# Patient Record
Sex: Male | Born: 1985 | Race: White | Hispanic: No | Marital: Single | Smoking: Never smoker
Health system: Southern US, Community
[De-identification: ages and names within clinical notes are randomized; demographics above are authoritative.]

## PROBLEM LIST (undated history)

## (undated) DIAGNOSIS — R634 Abnormal weight loss: Secondary | ICD-10-CM

## (undated) DIAGNOSIS — R29898 Other symptoms and signs involving the musculoskeletal system: Secondary | ICD-10-CM

## (undated) DIAGNOSIS — F419 Anxiety disorder, unspecified: Secondary | ICD-10-CM

## (undated) DIAGNOSIS — F32A Depression, unspecified: Secondary | ICD-10-CM

## (undated) DIAGNOSIS — F329 Major depressive disorder, single episode, unspecified: Secondary | ICD-10-CM

## (undated) DIAGNOSIS — G43909 Migraine, unspecified, not intractable, without status migrainosus: Secondary | ICD-10-CM

## (undated) HISTORY — DX: Other symptoms and signs involving the musculoskeletal system: R29.898

## (undated) HISTORY — DX: Abnormal weight loss: R63.4

## (undated) HISTORY — DX: Migraine, unspecified, not intractable, without status migrainosus: G43.909

## (undated) HISTORY — PX: KNEE SURGERY: SHX244

---
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
## 2006-04-09 IMAGING — CT CT HEAD WO/W CM
1 of 2 series · 13 of 30 positions shown, 17 images · IV contrast (omnipaque)
Comparison: None.

CLINICAL DATA: Right sided headaches with left sided numbness and blurred vision.
 HEAD CT WITHOUT AND WITH CONTRAST:
TECHNIQUE: Contiguous axial images were obtained from the base of the skull through the vertex according to standard protocol before and after administration of intravenous contrast.
 Contrast:  75 cc Omnipaque 300

[Series 2: head w/o · axial · non-contrast · 0.45mm/px · z∈[+13,+133]mm · 13 of 28 slices shown, 17 images]
[im 2/28  brain]
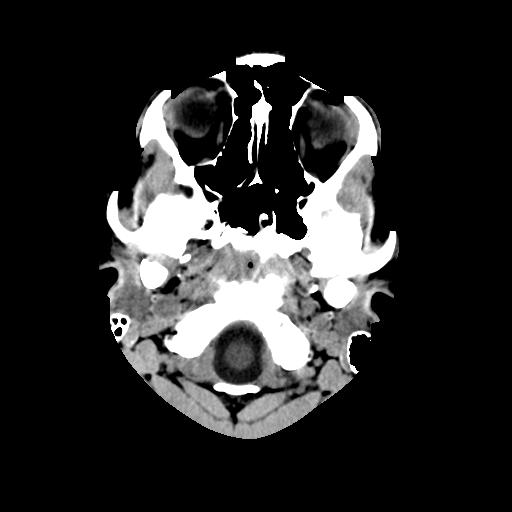
[im 2/28  bone]
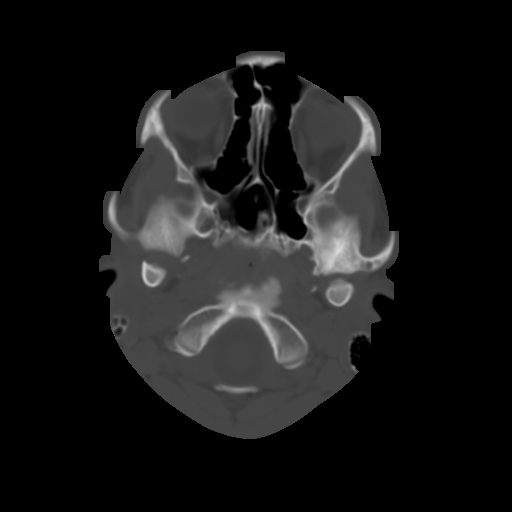
[im 4/28  brain]
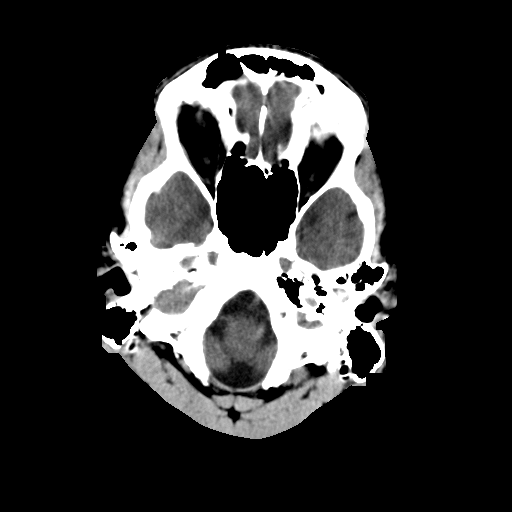
[im 6/28  brain]
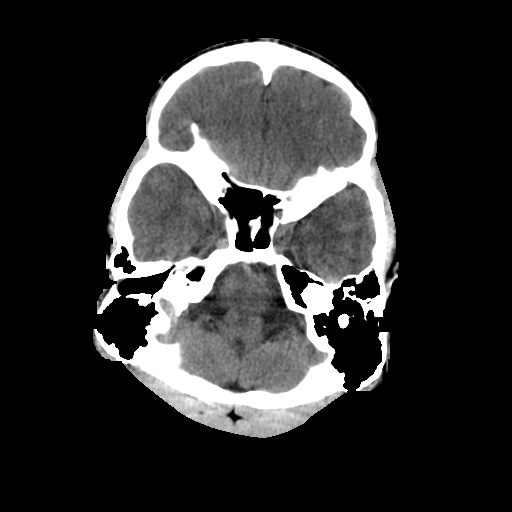
[im 8/28  brain]
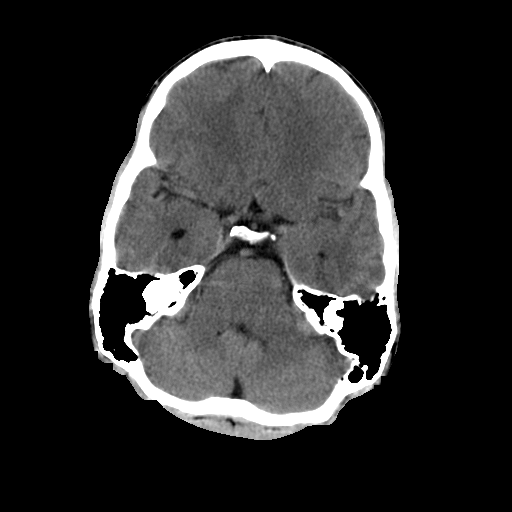
[im 10/28  brain]
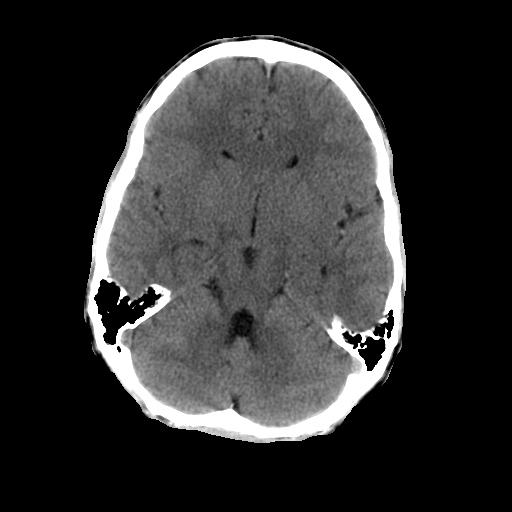
[im 10/28  bone]
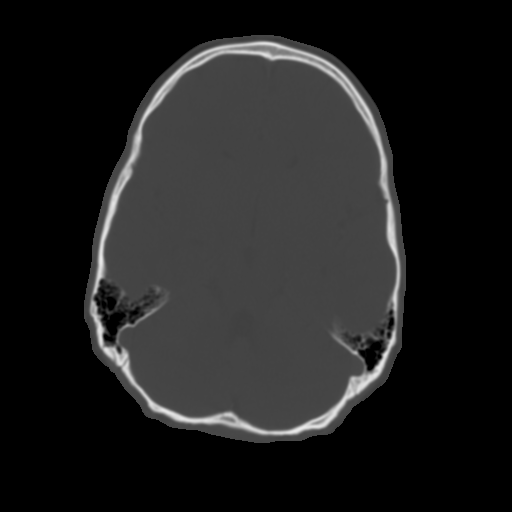
[im 12/28  brain]
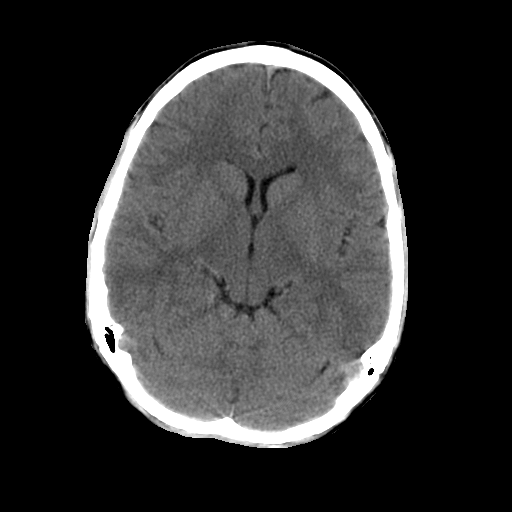
[im 14/28  brain]
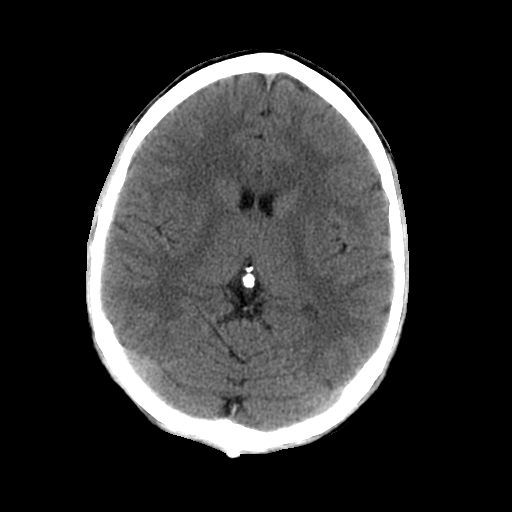
[im 16/28  brain]
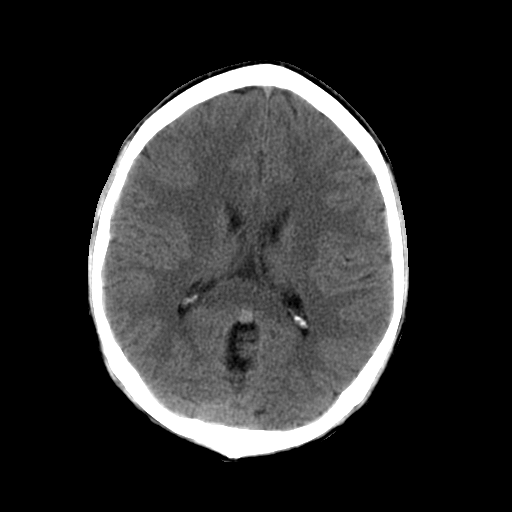
[im 18/28  brain]
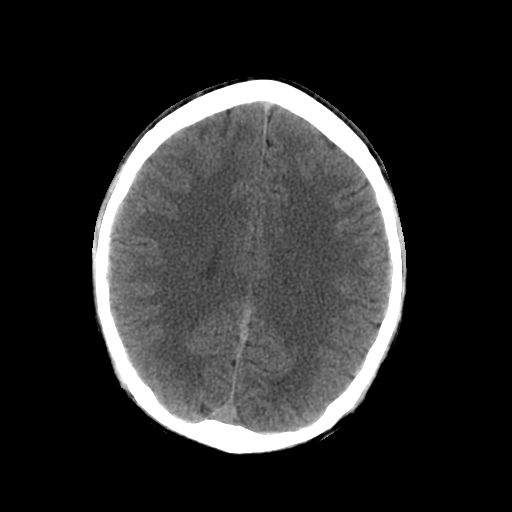
[im 18/28  bone]
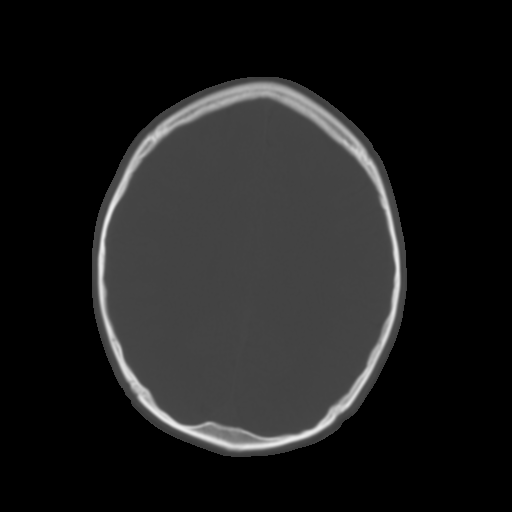
[im 20/28  brain]
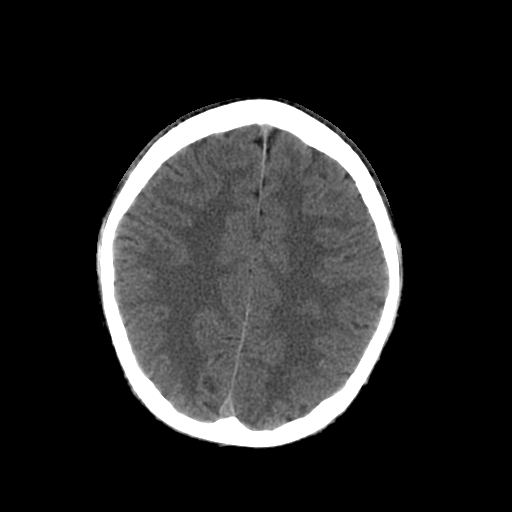
[im 22/28  brain]
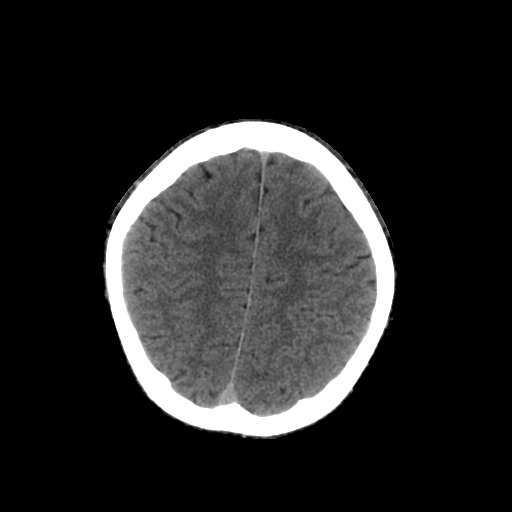
[im 24/28  brain]
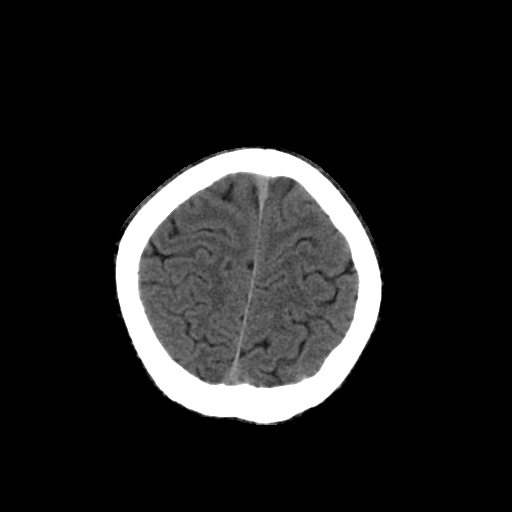
[im 26/28  brain]
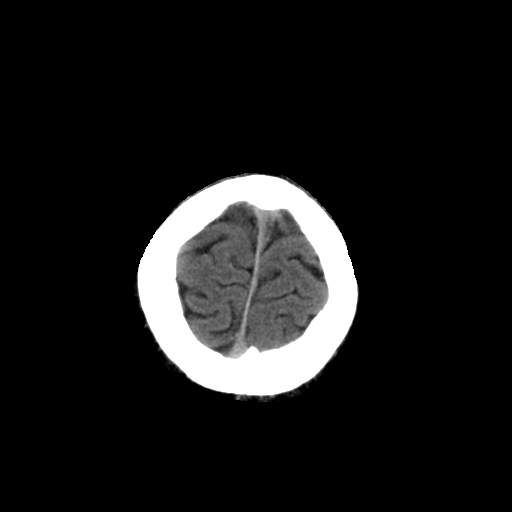
[im 26/28  bone]
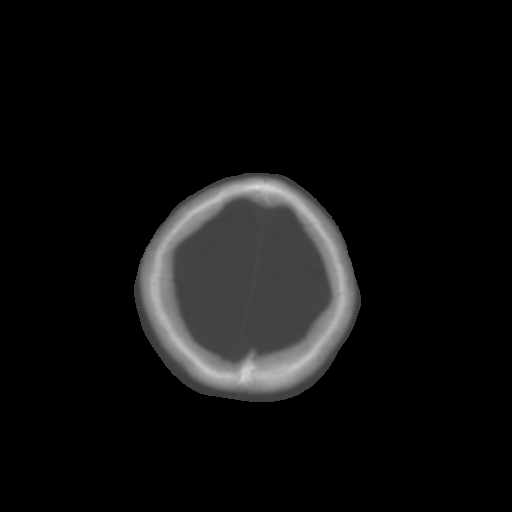

[13 of 30 positions shown; findings below may reference images not displayed]

FINDINGS: Ventricular size and CSF space is normal.  Only normal vascular structures appear to enhance following IV contrast.  Calvarium intact.  No fluid in the sinus is visualized.
IMPRESSION: No acute or significant findings.
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
## 2012-11-26 ENCOUNTER — Encounter: Payer: Self-pay | Admitting: Neurology

## 2012-11-28 ENCOUNTER — Encounter: Payer: Self-pay | Admitting: Neurology

## 2012-11-28 ENCOUNTER — Ambulatory Visit (INDEPENDENT_AMBULATORY_CARE_PROVIDER_SITE_OTHER): Payer: BC Managed Care – PPO | Admitting: Neurology

## 2012-11-28 VITALS — BP 106/66 | HR 66 | Ht 72.0 in | Wt 145.0 lb

## 2012-11-28 DIAGNOSIS — G7109 Other specified muscular dystrophies: Secondary | ICD-10-CM

## 2012-11-28 DIAGNOSIS — G7100 Muscular dystrophy, unspecified: Secondary | ICD-10-CM | POA: Insufficient documentation

## 2012-11-28 DIAGNOSIS — G43909 Migraine, unspecified, not intractable, without status migrainosus: Secondary | ICD-10-CM

## 2012-11-28 NOTE — Progress Notes (Signed)
[facility]  PATIENT: Brian Macias DOB: 14-Aug-1985  HISTORICAL  Brian Macias is a 27 years old right-handed male, referred by his primary care physician Tilden Fossa for evaluation of gradual onset muscle weakness, atrophy.  He began to notice muscle weakness around 2006, he could no longer do pushups, pull-ups, gradually worse, also noticed muscle atrophy involving bilateral upper extremity, upper body, over the years, his upper body muscle weakness, atrophy gradually progressed, now he can not bend in his elbow, he also noticed difficulty whistle, he was never able to move his upper lip, mild eye closure weakness.  He denies significant pain, he denies lower extremity muscle atrophy or weakness. He has occasionally fingertips paresthesia  He denies swallowing difficulty, no chewing difficulty.  REVIEW OF SYSTEMS: Full 14 system review of systems performed and notable only for bilateral upper extremity weakness,  ALLERGIES: No Known Allergies  HOME MEDICATIONS: Outpatient Prescriptions Prior to Visit  Medication Sig Dispense Refill  . Cholecalciferol (VITAMIN D PO) Take by mouth as directed.      . Cyanocobalamin (VITAMIN B 12 PO) Take by mouth as directed.      Marland Kitchen glucosamine-chondroitin 500-400 MG tablet Take 1 tablet by mouth as directed.      Marland Kitchen MILK THISTLE PO Take by mouth as directed.      . Misc Natural Products (NF FORMULAS TESTOSTERONE) CAPS Take by mouth as directed.      . Multiple Vitamins-Minerals (MULTIVITAMIN PO) Take by mouth as directed.      . Omega-3 Fatty Acids (ULTRA OMEGA 3 PO) Take by mouth as directed.      . Protein POWD Take by mouth as directed.      Marland Kitchen VITAMIN E PO Take by mouth as directed.       No facility-administered medications prior to visit.    PAST MEDICAL HISTORY: Past Medical History  Diagnosis Date  . Migraines   . Weight loss   . Muscle function loss     PAST SURGICAL HISTORY: Past Surgical History  Procedure  Laterality Date  . Knee surgery Right     FAMILY HISTORY: Family History  Problem Relation Age of Onset  . Asthma Father   . Allergies Father   . High Cholesterol Father   Dm      mother  SOCIAL HISTORY:  History   Social History  . Marital Status: Single    Spouse Name: N/A    Number of Children: 0  . Years of Education: college   Occupational History    Teacher, music, transcription,    Social History Main Topics  . Smoking status: Never Smoker   . Smokeless tobacco: Never Used  . Alcohol Use: 0.6 oz/week    1 Cans of beer per week     Comment: once a week.  . Drug Use: No  . Sexual Activity: Not on file   Other Topics Concern  . 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 And he is going to school full time.   Exercise - Kick boxing and Judo   Left handed.   Caffeine- once or twice a week tea.    PHYSICAL EXAM   Filed Vitals:   11/28/12 0805  BP: 106/66  Pulse: 66  Height: 6' (1.829 m)  Weight: 145 lb (65.772 kg)   Body mass index is 19.66 kg/(m^2).   Generalized: In no acute distress  Neck:  Supple, no carotid bruits   Cardiac: Regular rate rhythm  Pulmonary: Clear to auscultation bilaterally  Musculoskeletal: No deformity  Neurological examination  Mentation: Alert oriented to time, place, history taking, and causual conversation  Cranial nerve II-XII: Pupils were equal round reactive to light extraocular movements were full, visual field were full on confrontational test. facial sensation and strength were normal. hearing was intact to finger rubbing bilaterally. Uvula tongue midline.  head turning and shoulder shrug and were normal and symmetric.Tongue protrusion into cheek strength was normal.  Motor: fairly symmetric muscle atrophy involving bilateral biceps, brachialis, triceps, pectoralis major, latissimus dorsi.   there was no significant atrophy of bilateral lower  extremity muscles.  Shoulder abduction 4 plus/4 plus, external rotation 4 4, elbow flexion 2/2, extension 3/3, wrist flexion 5/5, wrist extension 4+/4+, grip 4+/4+, shoulder adduction 3+/3+, bilateral lower extremity muscle strength was normal. Lower abdomen beevor's sign when he tried to get up from lying down position.  Sensory: Intact to fine touch, pinprick, preserved vibratory sensation, and proprioception at toes.  Coordination: Normal finger to nose, heel-to-shin bilaterally there was no truncal ataxia  Gait: Rising up from seated position without assistance, normal stance, without trunk ataxia, moderate stride, good arm swing, smooth turning, able to perform tiptoe, and heel walking without difficulty.   Romberg signs: Negative  Deep tendon reflexes: Brachioradialis 2/2, biceps 2/2, triceps 2/2, patellar 2/2, Achilles trace, plantar responses were flexor bilaterally.   DIAGNOSTIC DATA (LABS, IMAGING, TESTING) - I reviewed patient records, labs, notes, testing and imaging myself where available.   ASSESSMENT AND PLAN   27 years old male, presenting with progressive muscle atrophy, and weakness, mainly involving bilateral facial muscles, scapulohumeral muscles, fairly symmetric, there was no family history of similar disease, 1.   Most consistent with facioscapulohumeral muscular atrophy, but the atypical features, he is symmetric clinical presentation, lack of family history, 10-30% of patient has de novo deletion. 2. I have suggested Athena genetic testing,  3. I will also refer him to [facility] clinic,  Levert Feinstein, M.D. Ph.D.  [facility] [address], [address] [place], [place] [address] [phone]

## 2012-11-28 NOTE — Patient Instructions (Signed)
[facility]  [address] [place], [id number]  [place]

## 2013-02-21 ENCOUNTER — Ambulatory Visit: Payer: BC Managed Care – PPO | Attending: Nurse Practitioner

## 2013-02-21 ENCOUNTER — Telehealth: Payer: Self-pay | Admitting: *Deleted

## 2013-02-21 DIAGNOSIS — IMO0001 Reserved for inherently not codable concepts without codable children: Secondary | ICD-10-CM | POA: Insufficient documentation

## 2013-02-21 DIAGNOSIS — M6281 Muscle weakness (generalized): Secondary | ICD-10-CM | POA: Insufficient documentation

## 2013-02-27 ENCOUNTER — Ambulatory Visit: Payer: BC Managed Care – PPO | Admitting: Physical Therapy

## 2013-02-28 ENCOUNTER — Ambulatory Visit: Payer: BC Managed Care – PPO | Admitting: Physical Therapy

## 2013-03-06 ENCOUNTER — Ambulatory Visit: Payer: BC Managed Care – PPO | Attending: Nurse Practitioner

## 2013-03-06 DIAGNOSIS — M6281 Muscle weakness (generalized): Secondary | ICD-10-CM | POA: Insufficient documentation

## 2013-03-06 DIAGNOSIS — IMO0001 Reserved for inherently not codable concepts without codable children: Secondary | ICD-10-CM | POA: Insufficient documentation

## 2013-03-08 ENCOUNTER — Ambulatory Visit: Payer: BC Managed Care – PPO | Admitting: Physical Therapy

## 2013-03-15 ENCOUNTER — Ambulatory Visit: Payer: BC Managed Care – PPO | Admitting: Physical Therapy

## 2013-03-17 ENCOUNTER — Ambulatory Visit: Payer: BC Managed Care – PPO | Admitting: Physical Therapy

## 2013-04-07 ENCOUNTER — Ambulatory Visit (INDEPENDENT_AMBULATORY_CARE_PROVIDER_SITE_OTHER): Payer: BC Managed Care – PPO | Admitting: Neurology

## 2013-04-07 ENCOUNTER — Encounter: Payer: Self-pay | Admitting: Neurology

## 2013-04-07 ENCOUNTER — Encounter (INDEPENDENT_AMBULATORY_CARE_PROVIDER_SITE_OTHER): Payer: Self-pay

## 2013-04-07 VITALS — BP 95/62 | HR 82 | Ht 72.0 in | Wt 142.0 lb

## 2013-04-07 DIAGNOSIS — G7100 Muscular dystrophy, unspecified: Secondary | ICD-10-CM

## 2013-04-07 DIAGNOSIS — G43909 Migraine, unspecified, not intractable, without status migrainosus: Secondary | ICD-10-CM

## 2013-04-07 DIAGNOSIS — G7109 Other specified muscular dystrophies: Secondary | ICD-10-CM

## 2013-04-07 NOTE — Patient Instructions (Signed)
[facility]  Echocardiogram  Return to clinic in 6-9 months  [facility] clinic refer

## 2013-04-07 NOTE — Progress Notes (Signed)
[facility]  PATIENT: Brian Macias DOB: 11/04/1985  HISTORICAL  Initial visit September 2014:  Brian Macias is a 28 years old right-handed male, referred by his primary care physician Tilden FossaLeslie Brown for evaluation of gradual onset muscle weakness, atrophy.  He began to notice muscle weakness around 2006, he could no longer do pushups, pull-ups, gradually worse, also noticed muscle atrophy involving bilateral upper extremity, upper body, over the years, his upper body muscle weakness, atrophy gradually progressed, now he can not bend in his elbow, he also noticed difficulty whistle, he was never able to move his upper lip, mild eye closure weakness.  He denies significant pain, he denies lower extremity muscle atrophy or weakness. He has occasionally fingertips paresthesia  He denies swallowing difficulty, no chewing difficulty.  He has difficulty release tight grip sometimes,  He has no chest pain, no shortness of breath, no visual loss.  He can still play soccer, he can walk fast,  Main weakness involves face and upper arms.   His mother is 27 years old, she has DM, over weight, knee problem, gait difficulty due to knee pain, MGF died of CAD. Father had CAD, and HTN, no muscle weakness.  His maternal uncle is very skinny, "like me, never play sport", his younger brother is [id number], is muscular and healthy.  UPDATE Feb 6th 2015:  He wants to be referred to [facility] clinic, he had physical therapy, helps some, wants to try occupational therapy, he continued to have significant bilateral upper extremity weakness, denies sensory changes, denies significant gait difficulty  I also performed electromyography study today, right biceps, increasing exertion activity, multiple small spikes polyphasic motor units, with early recruitment patterns, consistent with myopathic changes, the needle examination of right first dorsal interossei, where his hand muscle strength is relatively preserved, the  recruitment pattern, motor unit potential was relatively normal         REVIEW OF SYSTEMS: Full 14 system review of systems performed and notable only for bilateral upper extremity weakness,  ALLERGIES: No Known Allergies  HOME MEDICATIONS: Outpatient Prescriptions Prior to Visit  Medication Sig Dispense Refill  . Cholecalciferol (VITAMIN D PO) Take by mouth as directed.      . Cyanocobalamin (VITAMIN B 12 PO) Take by mouth as directed.      Marland Kitchen. glucosamine-chondroitin 500-400 MG tablet Take 1 tablet by mouth as directed.      Marland Kitchen. MILK THISTLE PO Take by mouth as directed.      . Misc Natural Products (NF FORMULAS TESTOSTERONE) CAPS Take by mouth as directed.      . Multiple Vitamins-Minerals (MULTIVITAMIN PO) Take by mouth as directed.      . Omega-3 Fatty Acids (ULTRA OMEGA 3 PO) Take by mouth as directed.      . Protein POWD Take by mouth as directed.      Marland Kitchen. VITAMIN E PO Take by mouth as directed.       No facility-administered medications prior to visit.    PAST MEDICAL HISTORY: Past Medical History  Diagnosis Date  . Migraines   . Weight loss   . Muscle function loss     PAST SURGICAL HISTORY: Past Surgical History  Procedure Laterality Date  . Knee surgery Right     FAMILY HISTORY: Family History  Problem Relation Age of Onset  . Asthma Father   . Allergies Father   . High Cholesterol Father   Dm      mother  SOCIAL HISTORY:  History  Social History  . Marital Status: Single    Spouse Name: N/A    Number of Children: 0  . Years of Education: college   Occupational History    Teacher, music, transcription,    Social History Main Topics  . Smoking status: Never Smoker   . Smokeless tobacco: Never Used  . Alcohol Use: 0.6 oz/week    1 Cans of beer per week     Comment: once a week.  . Drug Use: No  . Sexual Activity: Not on file   Other Topics Concern  . 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 And he is going to school full time.   Exercise - Kick boxing and Judo   Left handed.   Caffeine- once or twice a week tea.    PHYSICAL EXAM   Filed Vitals:   04/07/13 1444  BP: 95/62  Pulse: 82  Height: 6' (1.829 m)  Weight: 142 lb (64.411 kg)   Body mass index is 19.25 kg/(m^2).   Generalized: In no acute distress  Neck: Supple, no carotid bruits   Cardiac: Regular rate rhythm  Pulmonary: Clear to auscultation bilaterally  Musculoskeletal: No deformity  Neurological examination  Mentation: Alert oriented to time, place, history taking, and causual conversation  Cranial nerve II-XII: Pupils were equal round reactive to light extraocular movements were full, visual field were full on confrontational test. facial sensation and strength were normal. hearing was intact to finger rubbing bilaterally. Uvula tongue midline.  head turning and shoulder shrug and were normal and symmetric.Tongue protrusion into cheek strength was normal.  Motor: fairly symmetric muscle atrophy involving bilateral biceps, brachialis, triceps, pectoralis major, latissimus dorsi.  bilateral deltoid muscle strands, and wrist flexion, and hand muscle strands was relatively preserved,  there was no significant atrophy of bilateral lower extremity muscles.  Shoulder abduction 4 plus/4 plus, external rotation 4 4, elbow flexion 3/3, extension 3/3, wrist flexion 5/5, wrist extension 4/4 , grip 4+/4+, shoulder adduction 3+/3+, bilateral lower extremity muscle strength was normal. Lower abdomen beevor's sign when he tried to get up from lying down position.  Sensory: Intact to fine touch, pinprick, preserved vibratory sensation, and proprioception at toes.  Coordination: Normal finger to nose, heel-to-shin bilaterally there was no truncal ataxia  Gait: Rising up from seated position without assistance, normal stance, without trunk ataxia, moderate stride, good arm swing, smooth turning, able  to perform tiptoe, and heel walking without difficulty.   Romberg signs: Negative  Deep tendon reflexes: Brachioradialis 2/2, biceps 2/2, triceps 2/2, patellar 2/2, Achilles trace, plantar responses were flexor bilaterally.   DIAGNOSTIC DATA (LABS, IMAGING, TESTING) - I reviewed patient records, labs, notes, testing and imaging myself where available.   ASSESSMENT AND PLAN    28  years old male, presenting with progressive muscle atrophy, and weakness, mainly involving bilateral facial muscles, scapulohumeral muscles, fairly symmetric, there was no family history of similar disease,  1.   Most consistent with facioscapulohumeral muscular atrophy, but the atypical feature is symmetric clinical presentation, lack of family history, 10-30% of patient has de novo deletion. 2. I have referred him to [facility] clinic, 3 Will also do echocardiogram 4 return to clinic in 6 months.  Levert Feinstein, M.D. Ph.D.  [facility] [address] [place], [place] [address] [phone]

## 2013-04-25 ENCOUNTER — Ambulatory Visit: Payer: BC Managed Care – PPO | Admitting: Neurology

## 2013-04-27 ENCOUNTER — Other Ambulatory Visit (HOSPITAL_COMMUNITY): Payer: BC Managed Care – PPO

## 2013-06-01 ENCOUNTER — Telehealth: Payer: Self-pay

## 2013-06-01 NOTE — Telephone Encounter (Signed)
Called patient and left him a message about his follow up appt. At the [facility] clinic.

## 2013-09-29 NOTE — Telephone Encounter (Signed)
Done per Arma Headingana C

## 2013-09-29 NOTE — Telephone Encounter (Signed)
done

## 2014-01-05 ENCOUNTER — Telehealth: Payer: Self-pay | Admitting: Neurology

## 2014-01-05 NOTE — Telephone Encounter (Signed)
Left message for patient regarding new appointment date and time, rescheduled from 11/12 @ 2:30 to 11/16 @ 10:15.  Requested a return call if unable to keep appointment.

## 2014-01-11 ENCOUNTER — Ambulatory Visit: Payer: BC Managed Care – PPO | Admitting: Neurology

## 2014-01-15 ENCOUNTER — Ambulatory Visit: Payer: BC Managed Care – PPO | Admitting: Neurology

## 2014-01-16 ENCOUNTER — Encounter: Payer: Self-pay | Admitting: *Deleted

## 2014-09-21 DIAGNOSIS — Z0289 Encounter for other administrative examinations: Secondary | ICD-10-CM

## 2014-09-23 ENCOUNTER — Encounter (HOSPITAL_COMMUNITY): Payer: Self-pay

## 2014-09-23 ENCOUNTER — Emergency Department (HOSPITAL_COMMUNITY)
Admission: EM | Admit: 2014-09-23 | Discharge: 2014-09-25 | Disposition: A | Discharge status: Home / Self Care | Payer: Federal, State, Local not specified - Other | Attending: Emergency Medicine | Admitting: Emergency Medicine

## 2014-09-23 DIAGNOSIS — F1210 Cannabis abuse, uncomplicated: Secondary | ICD-10-CM | POA: Diagnosis present

## 2014-09-23 DIAGNOSIS — R636 Underweight: Secondary | ICD-10-CM | POA: Insufficient documentation

## 2014-09-23 DIAGNOSIS — M62529 Muscle wasting and atrophy, not elsewhere classified, unspecified upper arm: Secondary | ICD-10-CM | POA: Insufficient documentation

## 2014-09-23 DIAGNOSIS — F22 Delusional disorders: Secondary | ICD-10-CM | POA: Insufficient documentation

## 2014-09-23 DIAGNOSIS — F322 Major depressive disorder, single episode, severe without psychotic features: Secondary | ICD-10-CM | POA: Diagnosis present

## 2014-09-23 DIAGNOSIS — Z8669 Personal history of other diseases of the nervous system and sense organs: Secondary | ICD-10-CM | POA: Insufficient documentation

## 2014-09-23 DIAGNOSIS — Z79899 Other long term (current) drug therapy: Secondary | ICD-10-CM | POA: Insufficient documentation

## 2014-09-23 DIAGNOSIS — Z9189 Other specified personal risk factors, not elsewhere classified: Secondary | ICD-10-CM

## 2014-09-23 DIAGNOSIS — G43909 Migraine, unspecified, not intractable, without status migrainosus: Secondary | ICD-10-CM | POA: Insufficient documentation

## 2014-09-23 LAB — CBC WITH DIFFERENTIAL/PLATELET
Basophils Absolute: 0.1 10*3/uL (ref 0.0–0.1)
Basophils Relative: 0 % (ref 0–1)
Eosinophils Absolute: 0 10*3/uL (ref 0.0–0.7)
Eosinophils Relative: 0 % (ref 0–5)
HCT: 47.7 % (ref 39.0–52.0)
Hemoglobin: 15.1 g/dL (ref 13.0–17.0)
Lymphocytes Relative: 17 % (ref 12–46)
Lymphs Abs: 2.6 10*3/uL (ref 0.7–4.0)
MCH: 25.2 pg — ABNORMAL LOW (ref 26.0–34.0)
MCHC: 31.7 g/dL (ref 30.0–36.0)
MCV: 79.5 fL (ref 78.0–100.0)
Monocytes Absolute: 1.1 10*3/uL — ABNORMAL HIGH (ref 0.1–1.0)
Monocytes Relative: 7 % (ref 3–12)
Neutro Abs: 11.8 10*3/uL — ABNORMAL HIGH (ref 1.7–7.7)
Neutrophils Relative %: 76 % (ref 43–77)
Platelets: 382 10*3/uL (ref 150–400)
RBC: 6 MIL/uL — ABNORMAL HIGH (ref 4.22–5.81)
RDW: 13.4 % (ref 11.5–15.5)
WBC: 15.5 10*3/uL — ABNORMAL HIGH (ref 4.0–10.5)

## 2014-09-23 LAB — COMPREHENSIVE METABOLIC PANEL
ALK PHOS: 87 U/L (ref 38–126)
ALT: 42 U/L (ref 17–63)
AST: 47 U/L — ABNORMAL HIGH (ref 15–41)
Albumin: 4.9 g/dL (ref 3.5–5.0)
Anion gap: 14 (ref 5–15)
BUN: 14 mg/dL (ref 6–20)
CO2: 25 mmol/L (ref 22–32)
Calcium: 10.1 mg/dL (ref 8.9–10.3)
Chloride: 100 mmol/L — ABNORMAL LOW (ref 101–111)
Creatinine, Ser: 0.75 mg/dL (ref 0.61–1.24)
GFR calc Af Amer: >60 mL/min (ref >60)
GFR calc non Af Amer: >60 mL/min (ref >60)
Glucose, Bld: 86 mg/dL (ref 65–99)
Potassium: 4.4 mmol/L (ref 3.5–5.1)
Sodium: 139 mmol/L (ref 135–145)
Total Bilirubin: 1.3 mg/dL — ABNORMAL HIGH (ref 0.3–1.2)
Total Protein: 8.4 g/dL — ABNORMAL HIGH (ref 6.5–8.1)

## 2014-09-23 LAB — RAPID URINE DRUG SCREEN, HOSP PERFORMED
Amphetamines: NOT DETECTED
Barbiturates: NOT DETECTED
Benzodiazepines: NOT DETECTED
Cocaine: NOT DETECTED
Opiates: NOT DETECTED
TETRAHYDROCANNABINOL: POSITIVE — AB

## 2014-09-23 LAB — SALICYLATE LEVEL: Salicylate Lvl: <4 mg/dL (ref 2.8–30.0)

## 2014-09-23 LAB — ACETAMINOPHEN LEVEL: Acetaminophen (Tylenol), Serum: <10 ug/mL — ABNORMAL LOW (ref 10–30)

## 2014-09-23 LAB — ETHANOL: Alcohol, Ethyl (B): <5 mg/dL (ref <5)

## 2014-09-23 MED ORDER — ACETAMINOPHEN 325 MG PO TABS: 650.0000 mg | ORAL_TABLET | ORAL | Status: DC | PRN

## 2014-09-23 MED ORDER — ONDANSETRON HCL 4 MG PO TABS: 4.0000 mg | ORAL_TABLET | Freq: Three times a day (TID) | ORAL | Status: DC | PRN
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
## 2014-09-23 NOTE — ED Notes (Signed)
Pt wanded and belongings searched by security. 

## 2014-09-23 NOTE — ED Notes (Signed)
Pt reports that the police brought him here and he is denying SI/HI at this time. Unsure of why he was brought here. He reports that he was not talking to his parents for a few days.
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
## 2014-09-24 DIAGNOSIS — F322 Major depressive disorder, single episode, severe without psychotic features: Secondary | ICD-10-CM

## 2014-09-24 DIAGNOSIS — R45850 Homicidal ideations: Secondary | ICD-10-CM

## 2014-09-24 DIAGNOSIS — F1210 Cannabis abuse, uncomplicated: Secondary | ICD-10-CM | POA: Diagnosis present

## 2014-09-24 DIAGNOSIS — R45851 Suicidal ideations: Secondary | ICD-10-CM | POA: Diagnosis not present

## 2014-09-24 MED ORDER — NAPROXEN 500 MG PO TABS
500.0000 mg | ORAL_TABLET | Freq: Two times a day (BID) | ORAL | Status: DC | PRN
  Administered 2014-09-24 (×2): 500 mg via ORAL
  Filled 2014-09-24 (×2): qty 1

## 2014-09-24 MED ORDER — MIRTAZAPINE 7.5 MG PO TABS
7.5000 mg | ORAL_TABLET | Freq: Every day | ORAL | Status: DC
  Administered 2014-09-24: 7.5 mg via ORAL
  Filled 2014-09-24 (×2): qty 1

## 2014-09-24 MED ORDER — CYCLOBENZAPRINE HCL 10 MG PO TABS
5.0000 mg | ORAL_TABLET | Freq: Three times a day (TID) | ORAL | Status: DC | PRN
  Administered 2014-09-24: 5 mg via ORAL
  Filled 2014-09-24: qty 1

## 2014-09-24 NOTE — ED Notes (Signed)
Pt. Noted in room. Pt. C/o of some muscle pain. No distress or abnormal behavior noted. Will continue to monitor with security cameras. Q 15 minute rounds continue.

## 2014-09-24 NOTE — ED Notes (Signed)
Pt. To [facility] from ED ambulatory without difficulty, to room 34 . Report from  [facility]. Pt. Is alert and oriented, warm and dry in no distress. Pt.Upset about being in [facility]. Pt. Made aware of security cameras and Q15 minute rounds. Pt. Encouraged to let Nursing staff know of any concerns or needs.

## 2014-09-24 NOTE — ED Notes (Signed)
Pt. Noted sleeping in room. No complaints or concerns voiced. No distress or abnormal behavior noted. Will continue to monitor with security cameras. Q 15 minute rounds continue. 
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
## 2014-09-24 NOTE — ED Notes (Signed)
Sandwich and soft drink given.  

## 2014-09-24 NOTE — ED Notes (Signed)
Pt is appearing anxious and wanting to be discharged.  He denies SI and HI and AVH.  He has been in his room doing excercises all morning.  He is becoming aggitated about wanting discharge.  15 minute checks remain in place.
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
## 2014-09-24 NOTE — BH Assessment (Signed)
Seeking inpt placement. Sent referrals to:  Marilynne Halsted, [address], [place], [facility]    Clista Bernhardt, [place] Triage Specialist 09/24/2014 11:25 PM

## 2014-09-24 NOTE — Progress Notes (Signed)
CSW met with patient at bedside and asked if he would be interested in signing a release of information.  The patient signed the release and listed 2 of his friends. Patient stated that he does not have a good relationship with his parents right now.  Willette Brace [phone] ED CSW 09/24/2014 5:20 PM

## 2014-09-25 ENCOUNTER — Encounter (HOSPITAL_COMMUNITY): Payer: Self-pay | Admitting: *Deleted

## 2014-09-25 ENCOUNTER — Inpatient Hospital Stay (HOSPITAL_COMMUNITY)
Admission: AD | Admit: 2014-09-25 | Discharge: 2014-09-28 | DRG: 885 | Disposition: A | Discharge status: Home / Self Care | Payer: Federal, State, Local not specified - Other | Source: Intra-hospital | Attending: Psychiatry | Admitting: Psychiatry

## 2014-09-25 DIAGNOSIS — F1210 Cannabis abuse, uncomplicated: Secondary | ICD-10-CM | POA: Diagnosis present

## 2014-09-25 DIAGNOSIS — G710 Muscular dystrophy: Secondary | ICD-10-CM | POA: Diagnosis present

## 2014-09-25 DIAGNOSIS — F330 Major depressive disorder, recurrent, mild: Secondary | ICD-10-CM | POA: Diagnosis not present

## 2014-09-25 DIAGNOSIS — F39 Unspecified mood [affective] disorder: Secondary | ICD-10-CM | POA: Diagnosis present

## 2014-09-25 DIAGNOSIS — F329 Major depressive disorder, single episode, unspecified: Secondary | ICD-10-CM | POA: Diagnosis present

## 2014-09-25 DIAGNOSIS — F339 Major depressive disorder, recurrent, unspecified: Principal | ICD-10-CM

## 2014-09-25 DIAGNOSIS — F332 Major depressive disorder, recurrent severe without psychotic features: Secondary | ICD-10-CM | POA: Diagnosis not present

## 2014-09-25 HISTORY — DX: Anxiety disorder, unspecified: F41.9

## 2014-09-25 HISTORY — DX: Depression, unspecified: F32.A

## 2014-09-25 HISTORY — DX: Major depressive disorder, single episode, unspecified: F32.9

## 2014-09-25 MED ORDER — ONDANSETRON HCL 4 MG PO TABS: 4.0000 mg | ORAL_TABLET | Freq: Three times a day (TID) | ORAL | Status: DC | PRN

## 2014-09-25 MED ORDER — ACETAMINOPHEN 325 MG PO TABS: 650.0000 mg | ORAL_TABLET | ORAL | Status: DC | PRN

## 2014-09-25 MED ORDER — CYCLOBENZAPRINE HCL 10 MG PO TABS: 5.0000 mg | ORAL_TABLET | Freq: Three times a day (TID) | ORAL | Status: DC | PRN

## 2014-09-25 MED ORDER — MIRTAZAPINE 7.5 MG PO TABS
7.5000 mg | ORAL_TABLET | Freq: Every day | ORAL | Status: DC
  Administered 2014-09-25: 7.5 mg via ORAL
  Filled 2014-09-25 (×4): qty 1

## 2014-09-25 MED ORDER — NAPROXEN 500 MG PO TABS: 500.0000 mg | ORAL_TABLET | Freq: Two times a day (BID) | ORAL | Status: DC | PRN

## 2014-09-25 NOTE — Plan of Care (Signed)
Problem: Consults Goal: Depression Patient Education See Patient Education Module for education specifics.  Outcome: Progressing Nurse discussed depression/coping skills with patient.        
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
## 2014-09-25 NOTE — Tx Team (Signed)
Initial Interdisciplinary Treatment Plan   PATIENT STRESSORS: Educational concerns Health problems Marital or family conflict Medication change or noncompliance Occupational concerns Substance abuse   PATIENT STRENGTHS: Ability for insight Average or above average intelligence Capable of independent living Communication skills General fund of knowledge Motivation for treatment/growth Physical Health Supportive family/friends   PROBLEM LIST: Problem List/Patient Goals Date to be addressed Date deferred Reason deferred Estimated date of resolution  "suicidal thoughts" 09/25/2014   D/c  "depression" 09/25/2014   D/c  "anxiety" 09/25/2014   D/c  "substance abuse" 09/25/2014   D/c                                 DISCHARGE CRITERIA:  Ability to meet basic life and health needs Improved stabilization in mood, thinking, and/or behavior Medical problems require only outpatient monitoring Motivation to continue treatment in a less acute level of care Need for constant or close observation no longer present Reduction of life-threatening or endangering symptoms to within safe limits Safe-care adequate arrangements made Verbal commitment to aftercare and medication compliance Withdrawal symptoms are absent or subacute and managed without 24-hour nursing intervention  PRELIMINARY DISCHARGE PLAN: Attend aftercare/continuing care group Attend PHP/IOP Attend 12-step recovery group Outpatient therapy Participate in family therapy Return to previous living arrangement Return to previous work or school arrangements  PATIENT/FAMIILY INVOLVEMENT: This treatment plan has been presented to and reviewed with the patient, Brian Macias.  The patient and family have been given the opportunity to ask questions and make suggestions.  Earline Mayotte 09/25/2014, 6:13 PM

## 2014-09-25 NOTE — Progress Notes (Signed)
[facility] Group Notes:  (Nursing/MHT/Case Management/Adjunct)  Date:  09/25/2014  Time:  9:14 PM  Type of Therapy:  Psychoeducational Skills  Participation Level:  Active  Participation Quality:  Appropriate  Affect:  Appropriate  Cognitive:  Appropriate  Insight:  Good  Engagement in Group:  Engaged  Modes of Intervention:  Discussion  Summary of Progress/Problems: Tonight in wrap up group Zack stated that today he was able to meet great people, left the emergency location, and was able to talk to his bestfriend over the phone today, overall he rated his day a 7 or 8. Madaline Savage 09/25/2014, 9:14 PM

## 2014-09-25 NOTE — [facility] Group Notes (Signed)
[facility] LCSW Group Therapy 09/25/2014 1:15 PM  Type of Therapy: Group Therapy- Feelings about Diagnosis  Pt did not attend, declined invitation.   Chad Cordial, LCSWA 09/25/2014 5:02 PM

## 2014-09-25 NOTE — BH Assessment (Addendum)
[facility] Assessment Progress Note  Per Thedore Mins, MD, this pt requires psychiatric hospitalization at this time.  Berneice Heinrich, RN, [facility] has assigned pt to [facility] Rm [facility].  Pt has signed Consent to Release Information to his friend Dimitriy Ricka Burdock, and the signed document has been faxed to [facility] along with IVC documents.  Pt's nurse, Kendal Hymen, has been notified.  She agrees to send signed document to [facility] along with pt via [employer], and to call report to [phone].   Doylene Canning, MA Triage Specialist [phone]

## 2014-09-26 ENCOUNTER — Encounter (HOSPITAL_COMMUNITY): Payer: Self-pay | Admitting: Psychiatry

## 2014-09-26 DIAGNOSIS — F332 Major depressive disorder, recurrent severe without psychotic features: Secondary | ICD-10-CM

## 2014-09-26 DIAGNOSIS — F339 Major depressive disorder, recurrent, unspecified: Secondary | ICD-10-CM

## 2014-09-26 MED ORDER — BOOST / RESOURCE BREEZE PO LIQD
1.0000 | Freq: Two times a day (BID) | ORAL | Status: DC
  Administered 2014-09-26 – 2014-09-27 (×3): 1 via ORAL
  Filled 2014-09-26 (×7): qty 1

## 2014-09-26 MED ORDER — MIRTAZAPINE 15 MG PO TABS
15.0000 mg | ORAL_TABLET | Freq: Every day | ORAL | Status: DC
  Administered 2014-09-26 – 2014-09-27 (×2): 15 mg via ORAL
  Filled 2014-09-26 (×4): qty 1

## 2014-09-26 NOTE — [facility] Group Notes (Signed)
[facility] LCSW Aftercare Discharge Planning Group Note  09/26/2014 8:45 AM  Participation Quality: Alert, Appropriate and Oriented  Mood/Affect: Appropriate  Depression Rating: 3  Anxiety Rating: 1  Thoughts of Suicide: Pt denies SI/HI  Will you contract for safety? Yes  Current AVH: Pt denies  Plan for Discharge/Comments: Pt attended discharge planning group and actively participated in group. CSW discussed suicide prevention education with the group and encouraged them to discuss discharge planning and any relevant barriers. Pt presented with bright affect and was pleasant.  Pt is interested in a referral for a psychiatrist/therapist. He reports that he has a place to return to but describes not feeling safe there anymore.  Transportation Means: Pt reports access to transportation  Supports: No supports mentioned at this time  Chad Cordial, LCSWA 09/26/2014 11:01 AM

## 2014-09-26 NOTE — Progress Notes (Signed)
Adult Psychoeducational Group Note  Date:  09/26/2014 Time:  9:44 PM  Group Topic/Focus:  Wrap-Up Group:   The focus of this group is to help patients review their daily goal of treatment and discuss progress on daily workbooks.  Participation Level:  Active  Participation Quality:  Appropriate  Affect:  Appropriate  Cognitive:  Appropriate  Insight: Appropriate  Engagement in Group:  Engaged  Modes of Intervention:  Discussion  Additional Comments: The patient expressed that in group he learn different technique emotion.The patient also said that he learn to make a sad emotion to a happy emotion.  Octavio Manns 09/26/2014, 9:44 PM

## 2014-09-26 NOTE — Progress Notes (Deleted)
Recreation Therapy Notes  Date: 07.27.16 Time: 9:30 am Location: [address] Group Room  Group Topic: Stress Management  Goal Area(s) Addresses:  Patient will verbalize importance of using healthy stress management.  Patient will identify positive emotions associated with healthy stress management.   Intervention: Stress Management  Activity :  Guided Training and development officer.  LRT introduced the technique of guided imagery.  A script was used to deliver the technique to the patients.  Patients were asked to follow the script read a loud by LRT to engage in the technique of guided imagery.  Education:  Stress Management, Discharge Planning.   Education Outcome: Acknowledges edcuation/In group clarification offered/Needs additional education  Clinical Observations/Feedback: Patient did not attend group.   Caroll Rancher, LRT/CTRS         Caroll Rancher A 09/26/2014 12:28 PM

## 2014-09-26 NOTE — Progress Notes (Signed)
Recreation Therapy Notes  Date: 07.27.16 Time: 9:30 am Location: [address] Group Room  Group Topic: Stress Management  Goal Area(s) Addresses:  Patient will verbalize importance of using healthy stress management.  Patient will identify positive emotions associated with healthy stress management.   Behavioral Response: Engaged  Intervention: Stress Management  Activity :  Guided Training and development officer.  LRT introduced the technique of guided imagery.  A script was used to deliver the technique to the patients.  Patients were asked to follow the script read a loud by LRT to engage in the technique of guided imagery.  Education:  Stress Management, Discharge Planning.   Education Outcome: Acknowledges edcuation/In group clarification offered/Needs additional education  Clinical Observations/Feedback: Patient attended group.  Caroll Rancher, LRT/CTRS         Caroll Rancher A 09/26/2014 12:34 PM

## 2014-09-26 NOTE — Progress Notes (Signed)
NUTRITION ASSESSMENT  Pt identified as at risk on the Malnutrition Screen Tool  INTERVENTION: 1. Educated patient on the importance of nutrition and encouraged intake of food and beverages. 2. Discussed weight goals. 3. Supplements: will order Boost Breeze BID, each supplement provides 250 kcal and 9 grams of protein   NUTRITION DIAGNOSIS: Unintentional weight loss related to sub-optimal intake as evidenced by pt report.   Goal: Pt to meet >/= 90% of their estimated nutrition needs.  Monitor:  PO intake  Assessment:  Pt seen for MST. Notes indicate good intakes since admission and pt was even doing exercises in his room in the [facility]. Pt has had decreased energy and difficulty sleeping PTA and notes also indicate he had locked himself in his room at home x3 days PTA.  Likely meeting needs at [facility] but will order Boost Breeze BID to supplement.  29 y.o. male  Height: Ht Readings from Last 1 Encounters:  09/25/14  (1.803 m)    Weight: Wt Readings from Last 1 Encounters:  09/25/14 134 lb (60.782 kg)    Weight Hx: Wt Readings from Last 10 Encounters:  09/25/14 134 lb (60.782 kg)  04/07/13 142 lb (64.411 kg)  11/28/12 145 lb (65.772 kg)    BMI:  Body mass index is 18.7 kg/(m^2). Pt meets criteria for normal weight status based on current BMI.  Estimated Nutritional Needs: Kcal: 25-30 kcal/kg Protein: > 1 gram protein/kg Fluid: 1 ml/kcal  Diet Order: Diet regular Room service appropriate?: Yes; Fluid consistency:: Thin Pt is also offered choice of unit snacks mid-morning and mid-afternoon.  Pt is eating as desired.   Lab results and medications reviewed.      Trenton Gammon, RD, LDN Inpatient Clinical Dietitian Pager # [phone] After hours/weekend pager # [phone]

## 2014-09-26 NOTE — [facility] Group Notes (Signed)
[facility] LCSW Group Therapy 09/26/2014 1:15 PM  Type of Therapy: Group Therapy- Emotion Regulation  Participation Level: Active   Participation Quality:  Appropriate  Affect: Appropriate  Cognitive: Alert and Oriented   Insight:  Developing/Improving  Engagement in Therapy: Developing/Improving and Engaged   Modes of Intervention: Clarification, Confrontation, Discussion, Education, Exploration, Limit-setting, Orientation, Problem-solving, Rapport Building, Dance movement psychotherapist, Socialization and Support  Summary of Progress/Problems: The topic for group today was emotional regulation. This group focused on both positive and negative emotion identification and allowed group members to process ways to identify feelings, regulate negative emotions, and find healthy ways to manage internal/external emotions. Group members were asked to reflect on a time when their reaction to an emotion led to a negative outcome and explored how alternative responses using emotion regulation would have benefited them. Group members were also asked to discuss a time when emotion regulation was utilized when a negative emotion was experienced. Pt engaged appropriately in group and participated actively. He engaged in discussion about the biological components of depression. Pt describes difficulty recognizing depression related to external versus internal causes and knowing how to handle those intense emotions consequently. Pt was receptive to feedback from peers.   Chad Cordial, LCSWA 09/26/2014 4:25 PM
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
## 2014-09-26 NOTE — Progress Notes (Signed)
D. Pt had been up and visible in milieu this evening, did attend and participate in evening group activity. Pt did appear depressed but brightened on approach, spoke about how he is grateful for being here and learning different techniques to help with his depression and anxiety. Pt spoke about his health problems and how he has been trying to hide it and how people treat him when they find out and spoke about how shallow people can be. Pt did complain of pain this evening, notably in his legs but refused any medications to help with pain. Pt did receive medication for sleep without incident. A. Support and encouragement provided. R. Safety maintained, will continue to monitor.
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
## 2014-09-26 NOTE — Progress Notes (Signed)
D: Pt denies SI/HI/AV. Pt is pleasant and cooperative. Pt rates depression at a 3, anxiety at a 0, and Helplessness/hopelessness at a 6.  A: Pt was offered support and encouragement. Pt was given scheduled medications. Pt was encourage to attend groups. Q 15 minute checks were done for safety.  R:Pt attends some groups and interacts well with peers and staff. Pt taking medication. Pt has no complaints.Pt receptive to treatment and safety maintained on unit.

## 2014-09-27 DIAGNOSIS — F330 Major depressive disorder, recurrent, mild: Secondary | ICD-10-CM

## 2014-09-27 LAB — CBC WITH DIFFERENTIAL/PLATELET
Basophils Absolute: 0.1 10*3/uL (ref 0.0–0.1)
Basophils Relative: 1 % (ref 0–1)
Eosinophils Absolute: 0.2 10*3/uL (ref 0.0–0.7)
Eosinophils Relative: 2 % (ref 0–5)
HCT: 46.5 % (ref 39.0–52.0)
HEMOGLOBIN: 15.4 g/dL (ref 13.0–17.0)
LYMPHS ABS: 5.5 10*3/uL — AB (ref 0.7–4.0)
LYMPHS PCT: 43 % (ref 12–46)
MCH: 25.8 pg — ABNORMAL LOW (ref 26.0–34.0)
MCHC: 33.1 g/dL (ref 30.0–36.0)
MCV: 78 fL (ref 78.0–100.0)
MONOS PCT: 9 % (ref 3–12)
Monocytes Absolute: 1.2 10*3/uL — ABNORMAL HIGH (ref 0.1–1.0)
NEUTROS ABS: 5.9 10*3/uL (ref 1.7–7.7)
Neutrophils Relative %: 45 % (ref 43–77)
Platelets: 375 10*3/uL (ref 150–400)
RBC: 5.96 MIL/uL — AB (ref 4.22–5.81)
RDW: 13.3 % (ref 11.5–15.5)
WBC: 12.8 10*3/uL — AB (ref 4.0–10.5)

## 2014-09-27 NOTE — Progress Notes (Signed)
Attended Karaoke group tonight, and participated.  

## 2014-09-27 NOTE — Tx Team (Signed)
Interdisciplinary Treatment Plan Update (Adult) Date: 09/27/2014   Date: 09/27/2014 1:47 PM  Progress in Treatment:  Attending groups: Yes  Participating in groups: Yes  Taking medication as prescribed: Yes  Tolerating medication: Yes  Family/Significant othe contact made: No, CSW assessing for appropriate contact Patient understands diagnosis: Yes Discussing patient identified problems/goals with staff: Yes  Medical problems stabilized or resolved: Yes  Denies suicidal/homicidal ideation: Yes Patient has not harmed self or Others: Yes   New problem(s) identified: None identified at this time.   Discharge Plan or Barriers: Pt plans to return home and follow--up with [facility]  Additional comments: n/a   Reason for Continuation of Hospitalization:  Depression Medical Issues Medication stabilization Suicidal ideation Withdrawal symptoms  Estimated length of stay: 3-5 days  Review of initial/current patient goals per problem list:   1.  Goal(s): Patient will participate in aftercare plan  Met:  Yes  Target date: 09/27/2014   As evidenced by: Patient will participate within aftercare plan AEB aftercare provider and housing plan at discharge being identified.   7/28: Pt plans to return home and follow-up with [facility]  2.  Goal (s): Patient will exhibit decreased depressive symptoms and suicidal ideations.  Met:  Yes  Target date: 09/27/2014  As evidenced by: Patient will utilize self rating of depression at 3 or below and demonstrate decreased signs of depression or be deemed stable for discharge by MD.  7/28: Pt denies feelings or symptoms of depression and denies SI  Attendees:  Patient:    Family:    Physician: Dr. Sabra Heck, MD  09/27/2014 1:42 PM  Nursing: Lars Pinks, RN Case manager  09/27/2014 1:42 PM  Clinical Social Worker Peri Maris, Latanya Presser, MSW 09/27/2014 1:42 PM  Other: Lucinda Dell, Beverly Sessions Liasion 09/27/2014 1:42 PM   Clinical:  Mayra Neer, RN; Jinny Sanders, RN 09/27/2014 1:42 PM  Other: , RN Charge Nurse 09/27/2014 1:42 PM  Other:      Peri Maris, [place] MSW

## 2014-09-27 NOTE — BHH Group Notes (Signed)
[facility] Mental Health Association Group Therapy 09/27/2014 1:15pm  Type of Therapy: Mental Health Association Presentation  Participation Level: Active  Participation Quality: Attentive  Affect: Appropriate  Cognitive: Oriented  Insight: Developing/Improving  Engagement in Therapy: Engaged  Modes of Intervention: Discussion, Education and Socialization  Summary of Progress/Problems: Mental Health Association (MHA) Speaker came to talk about his personal journey with substance abuse and addiction. The pt processed ways by which to relate to the speaker. MHA speaker provided handouts and educational information pertaining to groups and services offered by the [facility]. Pt was engaged in speaker's presentation and was receptive to resources provided.    Chad Cordial, LCSWA 09/27/2014 1:37 PM

## 2014-09-27 NOTE — BHH Group Notes (Signed)
B[facility] Group Notes:  (Nursing/MHT/Case Management/Adjunct)  Date:  09/27/2014  Time:  0900 am  Type of Therapy:  Psychoeducational Skills  Participation Level:  Active  Participation Quality:  Appropriate and Attentive  Affect:  Appropriate  Cognitive:  Alert and Appropriate  Insight:  Good  Engagement in Group:  Supportive  Modes of Intervention:  Support  Summary of Progress/Problems: Patient had no concerns.  Requested scrubs for nighttime.    Cranford Mon 09/27/2014, 9:46 AM

## 2014-09-27 NOTE — Progress Notes (Signed)
D: Patient is calm and cooperative.  He has been attending groups and participating in his treatment.  He denies any depressive symptoms or anxiety.  His goal today is to "go home.  7th day in custody."  Patient is adamant about not speaking with his parents.  His mother has called TTS several times to inquire about him.  Patient is hoping to be discharged after his parents leave the country.  He is sleeping and eating well.  He is interacting well with staff and peers. A: Continue to monitor medication management and MD orders.  Safety checks completed every 15 minutes per protocol.  Offer encouragement and support as needed. R: Patient is receptive to staff.
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
## 2014-09-27 NOTE — Progress Notes (Signed)
Pt reports he has had a pretty good day.  He states he has learned some valuable things in groups today.  He denies SI/HI/AVH.  He has been in the dayroom most of the evening talking with peers.  He feels the medications are helping with his depression and anxiety.  He did not discuss his discharge plans with Clinical research associate.  Support and encouragement offered.  Pt makes his needs and concerns known to staff.  Safety maintained with q15 minute checks.

## 2014-09-28 MED ORDER — MIRTAZAPINE 15 MG PO TABS: 15.0000 mg | ORAL_TABLET | Freq: Every day | ORAL | Status: AC

## 2014-09-28 MED ORDER — MULTIVITAMIN PO LIQD: 1.0000 | ORAL | Status: AC

## 2014-09-28 NOTE — Progress Notes (Signed)
Recreation Therapy Notes  Date: 07.29.16 Time: 9:30 am Location: [address] Group Room  Group Topic: Stress Management  Goal Area(s) Addresses:  Patient will verbalize importance of using healthy stress management.  Patient will identify positive emotions associated with healthy stress management.   Intervention: Stress Management  Activity :  Progressive Muscle Relaxation.  LRT introduced and educated patients on the technique of progressive muscle relaxation.  A script was used to deliver the technique to the patients.  Patients were asked to follow the script read a loud by the LRT to engage in practicing the stress management technique.  Education:  Stress Management, Discharge Planning.   Education Outcome: Acknowledges edcuation/In group clarification offered/Needs additional education  Clinical Observations/Feedback: Patient did not attend group.   Caroll Rancher, LRT/CTRS         Lillia Abed, Theotis Gerdeman A 09/28/2014 3:20 PM

## 2014-09-28 NOTE — Progress Notes (Signed)
  [facility] Adult Case Management Discharge Plan :  Will you be returning to the same living situation after discharge:  Yes,  Pt returning to his home At discharge, do you have transportation home?: Yes,  Pt provided with a bus pass Do you have the ability to pay for your medications: Yes,  Pt provided with samples and prescriptions  Release of information consent forms completed and in the chart;  Patient's signature needed at discharge.  Patient to Follow up at: Follow-up Information    Follow up with [facility].   Specialty:  Behavioral Health   Why:  Please call the number listed below to schedule your appointments with a doctor and therapist. Office staff require appointments be scheduled directly with the patient.    Contact information:   [address] [place] [address] [phone]       Patient denies SI/HI: Yes,  Pt denies    Safety Planning and Suicide Prevention discussed: Yes,  with Pt; see SPE note for further details  Have you used any form of tobacco in the last 30 days? (Cigarettes, Smokeless Tobacco, Cigars, and/or Pipes): No (pt does not use any form of tobacco)  Has patient been referred to the Quitline?: N/A patient is not a smoker  Brian Macias 09/28/2014, 4:32 PM
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
## 2014-09-28 NOTE — [facility] Suicide Risk Assessment (Signed)
[facility] INPATIENT:  Family/Significant Other Suicide Prevention Education  Suicide Prevention Education:  Contact Attempts: Dimitry, Pt's friend, has been identified by the patient as the family member/significant other with whom the patient will be residing, and identified as the person(s) who will aid the patient in the event of a mental health crisis.  With written consent from the patient, two attempts were made to provide suicide prevention education, prior to and/or following the patient's discharge.  We were unsuccessful in providing suicide prevention education.  A suicide education pamphlet was given to the patient to share with family/significant other.  Date and time of first attempt: 09/28/14 @ 11:45am Date and time of second attempt: 09/28/14 @ 3:30pm  Elaina Hoops 09/28/2014, 4:31 PM
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
## 2014-09-28 NOTE — Progress Notes (Signed)
Pt reports he is doing good and feels the medications are helping him.  He says he has been going to groups.  He has been observed in the dayroom most of the time talking with peers and playing games with peers.  He says he wants to return home and f/u with [facility], but he wants to stay until his parents go out of the country on a planned trip.  He is still having conflict with his parents and does not want any contact with them.  He says they won't let him be an adult and take responsibility for himself.  Pt voices no needs or concerns on the unit and thanks writer for the care and concern of staff here.  Pt makes his needs known to staff.  Support and encouragement offered.  Safety maintained with q15 minute checks.

## 2016-12-14 NOTE — Telephone Encounter (Signed)
Close Encounter
